# Patient Record
Sex: Female | Born: 1990 | Hispanic: No | Marital: Married | Smoking: Never smoker
Health system: Southern US, Community
[De-identification: ages and names within clinical notes are randomized; demographics above are authoritative.]

## PROBLEM LIST (undated history)

## (undated) DIAGNOSIS — Z789 Other specified health status: Secondary | ICD-10-CM

## (undated) HISTORY — PX: WISDOM TOOTH EXTRACTION: SHX21

---

## 2021-03-04 LAB — OB RESULTS CONSOLE RUBELLA ANTIBODY, IGM: Rubella: IMMUNE

## 2021-03-11 LAB — OB RESULTS CONSOLE HIV ANTIBODY (ROUTINE TESTING): HIV: NONREACTIVE

## 2021-03-11 LAB — OB RESULTS CONSOLE HEPATITIS B SURFACE ANTIGEN: Hepatitis B Surface Ag: NEGATIVE

## 2021-03-12 ENCOUNTER — Inpatient Hospital Stay (HOSPITAL_COMMUNITY)
Admission: AD | Admit: 2021-03-12 | Payer: No Typology Code available for payment source | Source: Home / Self Care | Admitting: Obstetrics and Gynecology

## 2021-03-12 LAB — OB RESULTS CONSOLE ABO/RH: RH Type: NEGATIVE

## 2021-03-20 LAB — OB RESULTS CONSOLE GC/CHLAMYDIA
Chlamydia: NEGATIVE
Neisseria Gonorrhea: NEGATIVE

## 2021-04-13 NOTE — L&D Delivery Note (Signed)
Delivery Note At 8:55 PM a viable female was delivered via Vaginal, Spontaneous OA Presentation  APGAR: 9, 10; weight  pending.   Placenta status: Spontaneous, Intact.  Cord: 3 vessels with the following complications: None.  Cord pH: not obtained  Anesthesia: Epidural Episiotomy: None Lacerations: 2nd degree Suture Repair: 3.0 chromic Est. Blood Loss (mL): 300  Mom to postpartum.  Baby to Couplet care / Skin to Skin.  Jeani Hawking 09/27/2021, 9:08 PM

## 2021-04-25 ENCOUNTER — Other Ambulatory Visit: Payer: Self-pay | Admitting: Obstetrics and Gynecology

## 2021-04-25 DIAGNOSIS — N6310 Unspecified lump in the right breast, unspecified quadrant: Secondary | ICD-10-CM

## 2021-05-15 ENCOUNTER — Ambulatory Visit
Admission: RE | Admit: 2021-05-15 | Discharge: 2021-05-15 | Disposition: A | Discharge status: Home / Self Care | Payer: No Typology Code available for payment source | Source: Ambulatory Visit | Attending: Obstetrics and Gynecology | Admitting: Obstetrics and Gynecology

## 2021-05-15 DIAGNOSIS — N6310 Unspecified lump in the right breast, unspecified quadrant: Secondary | ICD-10-CM

## 2021-09-21 LAB — OB RESULTS CONSOLE GBS: GBS: NEGATIVE

## 2021-09-26 ENCOUNTER — Inpatient Hospital Stay (HOSPITAL_COMMUNITY)
Admission: AD | Admit: 2021-09-26 | Discharge: 2021-09-29 | DRG: 807 | Disposition: A | Discharge status: Home / Self Care | Payer: No Typology Code available for payment source | Attending: Obstetrics and Gynecology | Admitting: Obstetrics and Gynecology

## 2021-09-26 ENCOUNTER — Encounter (HOSPITAL_COMMUNITY): Payer: Self-pay

## 2021-09-26 ENCOUNTER — Other Ambulatory Visit: Payer: Self-pay

## 2021-09-26 DIAGNOSIS — O26893 Other specified pregnancy related conditions, third trimester: Secondary | ICD-10-CM | POA: Diagnosis present

## 2021-09-26 DIAGNOSIS — O4292 Full-term premature rupture of membranes, unspecified as to length of time between rupture and onset of labor: Secondary | ICD-10-CM | POA: Diagnosis present

## 2021-09-26 DIAGNOSIS — Z3A37 37 weeks gestation of pregnancy: Secondary | ICD-10-CM | POA: Diagnosis not present

## 2021-09-26 DIAGNOSIS — Z3A38 38 weeks gestation of pregnancy: Secondary | ICD-10-CM | POA: Diagnosis not present

## 2021-09-26 DIAGNOSIS — O99214 Obesity complicating childbirth: Secondary | ICD-10-CM | POA: Diagnosis present

## 2021-09-26 HISTORY — DX: Other specified health status: Z78.9

## 2021-09-26 LAB — CBC
HCT: 34.7 % — ABNORMAL LOW (ref 36.0–46.0)
Hemoglobin: 12.3 g/dL (ref 12.0–15.0)
MCH: 32.4 pg (ref 26.0–34.0)
MCHC: 35.4 g/dL (ref 30.0–36.0)
MCV: 91.3 fL (ref 80.0–100.0)
Platelets: 201 10*3/uL (ref 150–400)
RBC: 3.8 MIL/uL — ABNORMAL LOW (ref 3.87–5.11)
RDW: 12.8 % (ref 11.5–15.5)
WBC: 12.7 10*3/uL — ABNORMAL HIGH (ref 4.0–10.5)
nRBC: 0 % (ref 0.0–0.2)

## 2021-09-26 MED ORDER — LACTATED RINGERS IV SOLN: INTRAVENOUS | Status: DC

## 2021-09-26 NOTE — MAU Provider Note (Signed)
S: Ms. [place] Meek is a 31 y.o. G1P0 at [redacted]w[redacted]d  who presents to MAU today complaining of leaking of fluid since 2100. She denies vaginal bleeding. She denies contractions. She reports normal fetal movement.    O: BP 123/82   Pulse (!) 112   Temp 98.7 F (37.1 C)   Resp 20   SpO2 98%  GENERAL: Well-developed, well-nourished female in no acute distress.  HEAD: Normocephalic, atraumatic.  CHEST: Normal effort of breathing, regular heart rate ABDOMEN: Soft, nontender, gravid PELVIC: Normal external female genitalia. Vagina is pink and rugated. Cervix with normal contour, no lesions. Normal discharge.  Positive pooling.   Cervical exam deferred  Pt informed that the ultrasound is considered a limited OB ultrasound and is not intended to be a complete ultrasound exam.  Patient also informed that the ultrasound is not being completed with the intent of assessing for fetal or placental anomalies or any pelvic abnormalities.  Explained that the purpose of today's ultrasound is to assess for  presentation.  Patient acknowledges the purpose of the exam and the limitations of the study.     Vertex presentation confirmed  Fetal Monitoring: Baseline: 125 Variability: moderate Accelerations: 15x15 Decelerations: none Contractions: occasional uc's  Fern-positive   A: SIUP at [redacted]w[redacted]d  SROM  P: Report given to RN to contact MD on call for further instructions  Rolm Bookbinder, CNM 09/26/2021 10:33 PM

## 2021-09-26 NOTE — MAU Note (Signed)
Deborah Roberson is a 31 y.o. at Unknown here in MAU reporting: a small gush of clear fluid one hour ago. Pt is unsure if her water broke or if she peed. Pt reports she had continued to leak. Pt denies ctx's.  Reports decreased fetal movement in the last hour since the gush of fluid.  Pt reports vaginal spotting after a cervical check on Wednesday.   Onset of complaint: today 1 hour ago  Pain score: denies  There were no vitals filed for this visit.    Lab orders placed from triage:  none

## 2021-09-27 ENCOUNTER — Inpatient Hospital Stay (HOSPITAL_COMMUNITY): Payer: No Typology Code available for payment source | Admitting: Anesthesiology

## 2021-09-27 ENCOUNTER — Encounter (HOSPITAL_COMMUNITY): Payer: Self-pay | Admitting: Obstetrics and Gynecology

## 2021-09-27 LAB — TYPE AND SCREEN
ABO/RH(D): O NEG
Antibody Screen: POSITIVE

## 2021-09-27 LAB — RPR: RPR Ser Ql: NONREACTIVE

## 2021-09-27 MED ORDER — OXYTOCIN-SODIUM CHLORIDE 30-0.9 UT/500ML-% IV SOLN: 1.0000 m[IU]/min | INTRAVENOUS | Status: DC

## 2021-09-27 MED ORDER — ONDANSETRON HCL 4 MG PO TABS: 4.0000 mg | ORAL_TABLET | ORAL | Status: DC | PRN

## 2021-09-27 MED ORDER — MEASLES, MUMPS & RUBELLA VAC IJ SOLR: 0.5000 mL | Freq: Once | INTRAMUSCULAR | Status: DC

## 2021-09-27 MED ORDER — OXYTOCIN-SODIUM CHLORIDE 30-0.9 UT/500ML-% IV SOLN
1.0000 m[IU]/min | INTRAVENOUS | Status: DC
  Administered 2021-09-27: 2 m[IU]/min via INTRAVENOUS
  Filled 2021-09-27: qty 500

## 2021-09-27 MED ORDER — SOD CITRATE-CITRIC ACID 500-334 MG/5ML PO SOLN: 30.0000 mL | ORAL | Status: DC | PRN

## 2021-09-27 MED ORDER — LACTATED RINGERS IV SOLN: 500.0000 mL | INTRAVENOUS | Status: DC | PRN

## 2021-09-27 MED ORDER — FENTANYL-BUPIVACAINE-NACL 0.5-0.125-0.9 MG/250ML-% EP SOLN
12.0000 mL/h | EPIDURAL | Status: DC | PRN
  Administered 2021-09-27: 12 mL/h via EPIDURAL
  Filled 2021-09-27: qty 250

## 2021-09-27 MED ORDER — ONDANSETRON HCL 4 MG/2ML IJ SOLN: 4.0000 mg | INTRAMUSCULAR | Status: DC | PRN

## 2021-09-27 MED ORDER — OXYCODONE-ACETAMINOPHEN 5-325 MG PO TABS: 1.0000 | ORAL_TABLET | ORAL | Status: DC | PRN

## 2021-09-27 MED ORDER — EPHEDRINE 5 MG/ML INJ
10.0000 mg | INTRAVENOUS | Status: DC | PRN
Start: 2021-09-27 — End: 2021-09-27

## 2021-09-27 MED ORDER — WITCH HAZEL-GLYCERIN EX PADS: 1.0000 | MEDICATED_PAD | CUTANEOUS | Status: DC | PRN

## 2021-09-27 MED ORDER — ONDANSETRON HCL 4 MG/2ML IJ SOLN: 4.0000 mg | Freq: Four times a day (QID) | INTRAMUSCULAR | Status: DC | PRN

## 2021-09-27 MED ORDER — EPHEDRINE 5 MG/ML INJ: 10.0000 mg | INTRAVENOUS | Status: DC | PRN

## 2021-09-27 MED ORDER — PHENYLEPHRINE 80 MCG/ML (10ML) SYRINGE FOR IV PUSH (FOR BLOOD PRESSURE SUPPORT): 80.0000 ug | PREFILLED_SYRINGE | INTRAVENOUS | Status: DC | PRN

## 2021-09-27 MED ORDER — SIMETHICONE 80 MG PO CHEW: 80.0000 mg | CHEWABLE_TABLET | ORAL | Status: DC | PRN

## 2021-09-27 MED ORDER — BENZOCAINE-MENTHOL 20-0.5 % EX AERO
1.0000 | INHALATION_SPRAY | CUTANEOUS | Status: DC | PRN
  Filled 2021-09-27: qty 56

## 2021-09-27 MED ORDER — ZOLPIDEM TARTRATE 5 MG PO TABS: 5.0000 mg | ORAL_TABLET | Freq: Every evening | ORAL | Status: DC | PRN

## 2021-09-27 MED ORDER — TETANUS-DIPHTH-ACELL PERTUSSIS 5-2.5-18.5 LF-MCG/0.5 IM SUSY: 0.5000 mL | PREFILLED_SYRINGE | Freq: Once | INTRAMUSCULAR | Status: DC

## 2021-09-27 MED ORDER — LACTATED RINGERS IV SOLN: 500.0000 mL | Freq: Once | INTRAVENOUS | Status: DC

## 2021-09-27 MED ORDER — OXYCODONE-ACETAMINOPHEN 5-325 MG PO TABS: 2.0000 | ORAL_TABLET | ORAL | Status: DC | PRN

## 2021-09-27 MED ORDER — OXYTOCIN-SODIUM CHLORIDE 30-0.9 UT/500ML-% IV SOLN
2.5000 [IU]/h | INTRAVENOUS | Status: DC
  Administered 2021-09-27: 2.5 [IU]/h via INTRAVENOUS

## 2021-09-27 MED ORDER — MEDROXYPROGESTERONE ACETATE 150 MG/ML IM SUSP: 150.0000 mg | INTRAMUSCULAR | Status: DC | PRN

## 2021-09-27 MED ORDER — IBUPROFEN 600 MG PO TABS
600.0000 mg | ORAL_TABLET | Freq: Four times a day (QID) | ORAL | Status: DC
  Administered 2021-09-28 – 2021-09-29 (×7): 600 mg via ORAL
  Filled 2021-09-27 (×7): qty 1

## 2021-09-27 MED ORDER — SENNOSIDES-DOCUSATE SODIUM 8.6-50 MG PO TABS
2.0000 | ORAL_TABLET | Freq: Every day | ORAL | Status: DC
  Administered 2021-09-28 – 2021-09-29 (×2): 2 via ORAL
  Filled 2021-09-27 (×2): qty 2

## 2021-09-27 MED ORDER — ACETAMINOPHEN 325 MG PO TABS
650.0000 mg | ORAL_TABLET | ORAL | Status: DC | PRN
  Administered 2021-09-28: 650 mg via ORAL
  Filled 2021-09-27: qty 2

## 2021-09-27 MED ORDER — DIPHENHYDRAMINE HCL 50 MG/ML IJ SOLN: 12.5000 mg | INTRAMUSCULAR | Status: DC | PRN

## 2021-09-27 MED ORDER — DIBUCAINE (PERIANAL) 1 % EX OINT: 1.0000 | TOPICAL_OINTMENT | CUTANEOUS | Status: DC | PRN

## 2021-09-27 MED ORDER — TERBUTALINE SULFATE 1 MG/ML IJ SOLN: 0.2500 mg | Freq: Once | INTRAMUSCULAR | Status: DC | PRN

## 2021-09-27 MED ORDER — ACETAMINOPHEN 325 MG PO TABS: 650.0000 mg | ORAL_TABLET | ORAL | Status: DC | PRN

## 2021-09-27 MED ORDER — LIDOCAINE HCL (PF) 1 % IJ SOLN
INTRAMUSCULAR | Status: DC | PRN
  Administered 2021-09-27 (×2): 5 mL via EPIDURAL

## 2021-09-27 MED ORDER — COCONUT OIL OIL: 1.0000 | TOPICAL_OIL | Status: DC | PRN

## 2021-09-27 MED ORDER — BISACODYL 10 MG RE SUPP: 10.0000 mg | Freq: Every day | RECTAL | Status: DC | PRN

## 2021-09-27 MED ORDER — LIDOCAINE HCL (PF) 1 % IJ SOLN: 30.0000 mL | INTRAMUSCULAR | Status: DC | PRN

## 2021-09-27 MED ORDER — FLEET ENEMA 7-19 GM/118ML RE ENEM: 1.0000 | ENEMA | RECTAL | Status: DC | PRN

## 2021-09-27 MED ORDER — FLEET ENEMA 7-19 GM/118ML RE ENEM: 1.0000 | ENEMA | Freq: Every day | RECTAL | Status: DC | PRN

## 2021-09-27 MED ORDER — DIPHENHYDRAMINE HCL 25 MG PO CAPS: 25.0000 mg | ORAL_CAPSULE | Freq: Four times a day (QID) | ORAL | Status: DC | PRN

## 2021-09-27 MED ORDER — OXYTOCIN BOLUS FROM INFUSION
333.0000 mL | Freq: Once | INTRAVENOUS | Status: AC
  Administered 2021-09-27: 333 mL via INTRAVENOUS

## 2021-09-27 MED ORDER — PRENATAL MULTIVITAMIN CH
1.0000 | ORAL_TABLET | Freq: Every day | ORAL | Status: DC
Start: 2021-09-28 — End: 2021-09-29
  Administered 2021-09-28 – 2021-09-29 (×2): 1 via ORAL
  Filled 2021-09-27 (×2): qty 1

## 2021-09-27 NOTE — H&P (Signed)
Deborah Roberson is a 31 y.o. G 1 P 0 at 38 weeks presents with SROM around 12 hours ago. She initially declined pitocin. Now on L and D she reports no contractions.  She has a doula who will be here later and she is unsure if she will want an epidural. OB History     Gravida  1   Para      Term      Preterm      AB      Living         SAB      IAB      Ectopic      Multiple      Live Births  0          Past Medical History:  Diagnosis Date   Medical history non-contributory    Past Surgical History:  Procedure Laterality Date   WISDOM TOOTH EXTRACTION Bilateral    Family History: family history is not on file. Social History:  reports that she has never smoked. She has never used smokeless tobacco. She reports that she does not drink alcohol and does not use drugs.     Maternal Diabetes: No Genetic Screening: Normal Maternal Ultrasounds/Referrals: Normal Fetal Ultrasounds or other Referrals:  None Maternal Substance Abuse:  No Significant Maternal Medications:  None Significant Maternal Lab Results:  Group B Strep negative Other Comments:  None  Review of Systems  All other systems reviewed and are negative.  Maternal Medical History:  Reason for admission: Rupture of membranes.     Dilation: 2.5 Effacement (%): 50 Station: -2 Exam by:: Xitlaly Ault Blood pressure 120/63, pulse 86, temperature 97.9 F (36.6 C), temperature source Oral, resp. rate 20, SpO2 98 %. Maternal Exam:  Abdomen: Fetal presentation: vertex   Fetal Exam Fetal State Assessment: Category I - tracings are normal.   Physical Exam Vitals and nursing note reviewed. Exam conducted with a chaperone present.  Constitutional:      Appearance: Normal appearance.  HENT:     Head: Normocephalic.     Mouth/Throat:     Mouth: Mucous membranes are moist.  Eyes:     Pupils: Pupils are equal, round, and reactive to light.  Cardiovascular:     Rate and Rhythm: Normal rate and regular  rhythm.  Pulmonary:     Effort: Pulmonary effort is normal.  Neurological:     Mental Status: She is alert.     Prenatal labs: ABO, Rh: --/--/O NEG (06/16 2301) Antibody: POS (06/16 2301) Rubella: Immune (11/22 0000) RPR: NON REACTIVE (06/16 2301)  HBsAg: Negative (11/29 0000)  HIV: Non-reactive (11/29 0000)  GBS: Negative/-- (06/11 0000)   Assessment/Plan: IUP at term SROM Discussed with patient and her spouse given SROM already 12 hours I recommend Pitocin for labor augmentation They agree  Questions answered at the bedside   Jeani Hawking 09/27/2021, 8:42 AM

## 2021-09-27 NOTE — Anesthesia Preprocedure Evaluation (Signed)
Anesthesia Evaluation  Patient identified by MRN, date of birth, ID band Patient awake    Reviewed: Allergy & Precautions, Patient's Chart, lab work & pertinent test results  Airway Mallampati: II  TM Distance: >3 FB Neck ROM: Full    Dental no notable dental hx.    Pulmonary neg pulmonary ROS,    Pulmonary exam normal        Cardiovascular negative cardio ROS Normal cardiovascular exam     Neuro/Psych negative neurological ROS  negative psych ROS   GI/Hepatic negative GI ROS, Neg liver ROS,   Endo/Other  Obesity  Renal/GU negative Renal ROS  negative genitourinary   Musculoskeletal negative musculoskeletal ROS (+)   Abdominal (+) + obese,   Peds  Hematology negative hematology ROS (+)   Anesthesia Other Findings   Reproductive/Obstetrics (+) Pregnancy                             Anesthesia Physical Anesthesia Plan  ASA: 2  Anesthesia Plan: Epidural   Post-op Pain Management:    Induction:   PONV Risk Score and Plan:   Airway Management Planned: Natural Airway  Additional Equipment:   Intra-op Plan:   Post-operative Plan:   Informed Consent: I have reviewed the patients History and Physical, chart, labs and discussed the procedure including the risks, benefits and alternatives for the proposed anesthesia with the patient or authorized representative who has indicated his/her understanding and acceptance.       Plan Discussed with: Anesthesiologist  Anesthesia Plan Comments:         Anesthesia Quick Evaluation

## 2021-09-27 NOTE — Lactation Note (Signed)
This note was copied from a baby's chart. Lactation Consultation Note Assisted in latching mom. Needed to do football hold. Baby constantly off and on. Unable to maintain latch. Will latch and suckle a couple of times when stops suckling looses latch. T-cup hold needed to hold breast tissue in baby's mouth. Breast tissue is very compressible. Mom will need shells and DEBP when she gets to [facility]. LC will f/u w/mom on MBU.  Patient Name: Deborah Roberson Today's Date: 09/27/2021 Reason for consult: L&D Initial assessment;Primapara;Early term 37-38.6wks Age:76 hours  Maternal Data Does the patient have breastfeeding experience prior to this delivery?: No  Feeding    LATCH Score Latch: Repeated attempts needed to sustain latch, nipple held in mouth throughout feeding, stimulation needed to elicit sucking reflex.  Audible Swallowing: None  Type of Nipple: Flat  Comfort (Breast/Nipple): Soft / non-tender  Hold (Positioning): Full assist, staff holds infant at breast  LATCH Score: 4   Lactation Tools Discussed/Used    Interventions Interventions: Adjust position;Assisted with latch;Support pillows;Skin to skin;Position options;Breast massage;Breast compression  Discharge    Consult Status Consult Status: Follow-up from L&D Date: 09/28/21 Follow-up type: In-patient    Charyl Dancer 09/27/2021, 10:12 PM

## 2021-09-27 NOTE — Anesthesia Procedure Notes (Signed)
Epidural Patient location during procedure: OB Start time: 09/27/2021 2:08 PM End time: 09/27/2021 2:17 PM  Staffing Anesthesiologist: Mal Amabile, MD Performed: anesthesiologist   Preanesthetic Checklist Completed: patient identified, IV checked, site marked, risks and benefits discussed, surgical consent, monitors and equipment checked, pre-op evaluation and timeout performed  Epidural Patient position: sitting Prep: DuraPrep and site prepped and draped Patient monitoring: continuous pulse ox and blood pressure Approach: midline Location: L3-L4 Injection technique: LOR air  Needle:  Needle type: Tuohy  Needle gauge: 17 G Needle length: 9 cm and 9 Needle insertion depth: 6 cm Catheter type: closed end flexible Catheter size: 19 Gauge Catheter at skin depth: 11 cm Test dose: negative and Other  Assessment Events: blood not aspirated, injection not painful, no injection resistance, no paresthesia and negative IV test  Additional Notes Patient identified. Risks and benefits discussed including failed block, incomplete  Pain control, post dural puncture headache, nerve damage, paralysis, blood pressure Changes, nausea, vomiting, reactions to medications-both toxic and allergic and post Partum back pain. All questions were answered. Patient expressed understanding and wished to proceed. Sterile technique was used throughout procedure. Epidural site was Dressed with sterile barrier dressing. No paresthesias, signs of intravascular injection Or signs of intrathecal spread were encountered.  Patient was more comfortable after the epidural was dosed. Please see RN's note for documentation of vital signs and FHR which are stable. Reason for block:procedure for pain

## 2021-09-28 LAB — CBC
HCT: 32.6 % — ABNORMAL LOW (ref 36.0–46.0)
Hemoglobin: 11.6 g/dL — ABNORMAL LOW (ref 12.0–15.0)
MCH: 32 pg (ref 26.0–34.0)
MCHC: 35.6 g/dL (ref 30.0–36.0)
MCV: 89.8 fL (ref 80.0–100.0)
Platelets: 189 10*3/uL (ref 150–400)
RBC: 3.63 MIL/uL — ABNORMAL LOW (ref 3.87–5.11)
RDW: 12.7 % (ref 11.5–15.5)
WBC: 18.8 10*3/uL — ABNORMAL HIGH (ref 4.0–10.5)
nRBC: 0 % (ref 0.0–0.2)

## 2021-09-28 MED ORDER — RHO D IMMUNE GLOBULIN 1500 UNIT/2ML IJ SOSY
300.0000 ug | PREFILLED_SYRINGE | Freq: Once | INTRAMUSCULAR | Status: AC
Start: 2021-09-28 — End: 2021-09-28
  Administered 2021-09-28: 300 ug via INTRAVENOUS
  Filled 2021-09-28: qty 2

## 2021-09-28 NOTE — Lactation Note (Addendum)
This note was copied from a baby's chart. Lactation Consultation Note  Patient Name: Deborah Roberson Today's Date: 09/28/2021 Reason for consult: Initial assessment;Primapara;1st time breastfeeding;Early term 37-38.6wks;Breastfeeding assistance;Other (Comment);Infant weight loss (2 % weight loss) Age:32 hours/ P1  As LC entered the room mom starting to BF with dads assistance. Dad mentioned he had changed a smear diaper.  LC offered to assist  to latch, mom receptive.  LC reviewed breast feeding goals for 24 hours , and steps for latching.  LC noted areola edema - and recommended prior to latching on the 1st breast  Breast massage, hand express, and reverse pressure ( areolas soften well ) ,  Baby made a good effort to latch for 5-6 strong sucks and released.  After latch attempt, hand expressed tiny drops. Latch score 6  Dr. Earlene Plater in to exam baby, and baby settled in the crib.  Mom and dad aware LC will F/U for latch assessment  Maternal Data Has patient been taught Hand Expression?: Yes Per mom + breast changes with pregnancy  Feeding Mother's Current Feeding Choice: Breast Milk  LATCH Score Latch: Repeated attempts needed to sustain latch, nipple held in mouth throughout feeding, stimulation needed to elicit sucking reflex.  Audible Swallowing: None  Type of Nipple: Everted at rest and after stimulation (areola edema)  Comfort (Breast/Nipple): Soft / non-tender  Hold (Positioning): Assistance needed to correctly position infant at breast and maintain latch.  LATCH Score: 6   Lactation Tools Discussed/Used    Interventions Interventions: Breast feeding basics reviewed;Assisted with latch;Skin to skin;Breast massage;Hand express;Reverse pressure;Breast compression;Adjust position;Support pillows;Position options;Education;LC Services brochure  Discharge Pump: DEBP;Personal WIC Program: No  Consult Status Consult Status: Follow-up Date: 09/28/21 Follow-up type:  In-patient    Deborah Roberson 09/28/2021, 8:47 AM

## 2021-09-28 NOTE — Progress Notes (Signed)
CSW received consult for hx of Anxiety.  CSW met with MOB to offer support and complete assessment.    CSW met with MOB at bedside to complete assessment. When CSW entered, MOB was observed sitting in hospital bed, FOB was present sitting on couch, and infant was sleeping in basinet. MOB granted verbal permission to speak in front of FOB about anything. MOB presented as calm and relaxed and was observed smiling. MOB and FOB were polite and engaged towards CSW.  CSW inquired how MOB has been feeling emotionally since giving birth, MOB reported she has been feeling really good and joyful. MOB reported a history of anxiety and "OCD tendencies" throughout her life. MOB denies a history of depression. MOB reports she was previously prescribed lexapro through her PCP 2019-2021 and discontinued medication per PCP's approval due to wanting to start a family and having sufficient coping skills. MOB reports she was also in therapy in the past (2019-2020) but is not current with a therapist. CSW provided a list of therapy resources to MOB in the case that MOB feels she needs additional support in the future.   MOB reports some prenatal anxiety during her first trimester stating her anxiety was triggered by health concerns about the health of her and the baby, marked by "overthinking". MOB reports anxiety gradually improved after the first trimester. MOB reports she contacted therapists during her first trimester but was unable to secure an appointment and did not follow up after her anxiety symptoms decreased into her second trimester. MOB reports she is open to psychotropic medication management and/or therapy if she experiences increased anxiety in the future but declined the need for mental health services at this time. MOB denied current SI/HI/DV.   MOB reports a strong support network, marked by FOB, MOB's parents, and friends. MOB identified coping skills including "talking it out", having her support network  tell her when she is having irrational thoughts, and using words of affirmation.   MOB reports she has all necessary baby items including a car seat, basinet, and crib. MOB declined additional parenting resources at this time.   CSW provided education regarding the baby blues period vs. perinatal mood disorders, discussed treatment and gave resources for mental health follow up if concerns arise.  CSW recommends self-evaluation during the postpartum time period using the New Mom Checklist from Postpartum Progress and encouraged MOB to contact a medical professional if symptoms are noted at any time.   CSW provided review of Sudden Infant Death Syndrome (SIDS) precautions.   CSW identifies no further need for intervention and no barriers to discharge at this time. 

## 2021-09-28 NOTE — Progress Notes (Signed)
PPD # 1  Doing well. Notices some soreness. BP 122/75 (BP Location: Right Arm)   Pulse 96   Temp 98.2 F (36.8 C) (Oral)   Resp 16   Ht 5\' 4"  (1.626 m)   Wt 97.5 kg   SpO2 98% Comment: Room Air  Breastfeeding Unknown   BMI 36.90 kg/m  Results for orders placed or performed during the hospital encounter of 09/26/21 (from the past 24 hour(s))  CBC     Status: Abnormal   Collection Time: 09/28/21  5:10 AM  Result Value Ref Range   WBC 18.8 (H) 4.0 - 10.5 K/uL   RBC 3.63 (L) 3.87 - 5.11 MIL/uL   Hemoglobin 11.6 (L) 12.0 - 15.0 g/dL   HCT 09/30/21 (L) 96.0 - 45.4 %   MCV 89.8 80.0 - 100.0 fL   MCH 32.0 26.0 - 34.0 pg   MCHC 35.6 30.0 - 36.0 g/dL   RDW 09.8 11.9 - 14.7 %   Platelets 189 150 - 400 K/uL   nRBC 0.0 0.0 - 0.2 %  Rh IG workup (includes ABO/Rh)     Status: None (Preliminary result)   Collection Time: 09/28/21  5:19 AM  Result Value Ref Range   Gestational Age(Wks) 38    Fetal Screen NEG    Unit Number 39    Blood Component Type RHIG    Unit division 00    Status of Unit ALLOCATED    Transfusion Status      OK TO TRANSFUSE Performed at [facility], [address], [place], [place]    Lochia WNL Uterus is WNL  PPD # 1  Doing well Circ tomorrow - baby has not had a good feeding yet Discharge home tomorrow

## 2021-09-28 NOTE — Anesthesia Postprocedure Evaluation (Signed)
Anesthesia Post Note  Patient: Deborah Roberson  Procedure(s) Performed: AN AD HOC LABOR EPIDURAL     Patient location during evaluation: Mother Baby Anesthesia Type: Epidural Level of consciousness: awake and alert Pain management: pain level controlled Vital Signs Assessment: post-procedure vital signs reviewed and stable Respiratory status: spontaneous breathing, nonlabored ventilation and respiratory function stable Cardiovascular status: stable Postop Assessment: no headache, no backache, epidural receding, no apparent nausea or vomiting, adequate PO intake and able to ambulate Anesthetic complications: no   No notable events documented.  Last Vitals:  Vitals:   09/28/21 0430 09/28/21 0910  BP: 122/75 128/75  Pulse: 96 100  Resp: 16 18  Temp: 36.8 C 36.9 C  SpO2: 98% 98%    Last Pain:  Vitals:   09/28/21 0910  TempSrc: Oral  PainSc: 2    Pain Goal: Patients Stated Pain Goal: 2 (09/28/21 0910)                 Laban Emperor

## 2021-09-29 LAB — RH IG WORKUP (INCLUDES ABO/RH)
Fetal Screen: NEGATIVE
Gestational Age(Wks): 38
Unit division: 0

## 2021-09-29 NOTE — Discharge Summary (Signed)
Postpartum Discharge Summary  Date of Service updated 09/29/21     Patient Name: Deborah Roberson DOB: [birth year] MRN: [id number]  Date of admission: 09/26/2021 Delivery date:09/27/2021  Delivering provider: Dian Queen  Date of discharge: 09/29/2021  Admitting diagnosis: Normal labor and delivery [O80] NSVD (normal spontaneous vaginal delivery) [O80] Intrauterine pregnancy: [redacted]w[redacted]d    Secondary diagnosis:  Principal Problem:   Normal labor and delivery Active Problems:   NSVD (normal spontaneous vaginal delivery)  Additional problems: None    Discharge diagnosis: Term Pregnancy Delivered                                              Augmentation: Pitocin Complications: None  Hospital course: Induction of Labor With Vaginal Delivery   31y.o. yo G1P1001 at 33w0das admitted to the hospital 09/26/2021 for induction of labor.  Indication for induction: PROM.  Patient had an uncomplicated labor course as follows: Membrane Rupture Time/Date: 9:00 PM ,09/26/2021   Delivery Method:Vaginal, Spontaneous  Episiotomy: None  Lacerations:  2nd degree;Perineal  Details of delivery can be found in separate delivery note.  Patient had a routine postpartum course. Patient is discharged home 09/29/21.  Newborn Data: Birth date:09/27/2021  Birth time:8:55 PM  Gender:Female  Living status:Living  Apgars:9 ,10  Weight:3350 g   Magnesium Sulfate received: No BMZ received: No MMR:N/A T-DaP:Given prenatally Flu: N/A Transfusion:No  Physical exam  Vitals:   09/28/21 0910 09/28/21 1220 09/28/21 2116 09/29/21 0622  BP: 128/75 127/80 128/70 121/73  Pulse: 100 (!) 105 91 84  Resp: _0 Temp: 98.4 F (36.9 C) 98.4 F (36.9 C) 98.2 F (36.8 C) 97.7 F (36.5 C)  TempSrc: Oral Oral Oral Oral  SpO2: 98% 98% 97% 98%  Weight:      Height:       General: alert Lochia: appropriate Uterine Fundus: firm Incision: N/A DVT Evaluation: No evidence of DVT seen on physical  exam. Labs: Lab Results  Component Value Date   WBC 18.8 (H) 09/28/2021   HGB 11.6 (L) 09/28/2021   HCT 32.6 (L) 09/28/2021   MCV 89.8 09/28/2021   PLT 189 09/28/2021       No data to display         Edinburgh Score:    09/27/2021   11:30 PM  Edinburgh Postnatal Depression Scale Screening Tool  I have been able to laugh and see the funny side of things. 0  I have looked forward with enjoyment to things. 0  I have blamed myself unnecessarily when things went wrong. 0  I have been anxious or worried for no good reason. 1  I have felt scared or panicky for no good reason. 1  Things have been getting on top of me. 0  I have been so unhappy that I have had difficulty sleeping. 0  I have felt sad or miserable. 0  I have been so unhappy that I have been crying. 1  The thought of harming myself has occurred to me. 0  Edinburgh Postnatal Depression Scale Total 3      After visit meds:  Allergies as of 09/29/2021   No Known Allergies      Medication List     TAKE these medications    prenatal multivitamin Tabs tablet Take 1 tablet by mouth daily at 12 noon.  Discharge home in stable condition Infant Feeding: Bottle and Breast Infant Disposition:home with mother Discharge instruction: per After Visit Summary and Postpartum booklet. Activity: Advance as tolerated. Pelvic rest for 6 weeks.  Diet: routine diet Anticipated Birth Control: Unsure Postpartum Appointment:6 weeks Additional Postpartum F/U:  None Future Appointments:No future appointments. Follow up Visit:      09/29/2021 Tyson Dense, MD

## 2021-09-29 NOTE — Lactation Note (Signed)
This note was copied from a baby's chart. Lactation Consultation Note  Patient Name: Deborah Roberson Today's Date: 09/29/2021 Reason for consult: Follow-up assessment Age:32 hours  P1, Baby 35 hours old and request to assist with latching. Reviewed hand expression with good flow. Assisted with helping latch deeper on breast. Noted a few short sucking bursts.  Latch depth improved with teacup hold.  Baby had short feeding and fell asleep. Suggest calling when baby wakes to assist with another feeding. Both parents very attentive and wanting to learn.  Encouraged OP appt.   Reviewed engorgement care and monitoring voids/stools.   Maternal Data Has patient been taught Hand Expression?: Yes Does the patient have breastfeeding experience prior to this delivery?: No  Feeding Mother's Current Feeding Choice: Breast Milk  LATCH Score Latch: Repeated attempts needed to sustain latch, nipple held in mouth throughout feeding, stimulation needed to elicit sucking reflex.  Audible Swallowing: A few with stimulation  Type of Nipple: Everted at rest and after stimulation  Comfort (Breast/Nipple): Soft / non-tender  Hold (Positioning): Assistance needed to correctly position infant at breast and maintain latch.  LATCH Score: 7   Lactation Tools Discussed/Used    Interventions Interventions: Breast feeding basics reviewed;Assisted with latch;Skin to skin;Hand express;Education  Discharge Discharge Education: Engorgement and breast care;Outpatient recommendation;Warning signs for feeding baby Pump: Personal;DEBP (Spectra)  Consult Status Consult Status: Follow-up Date: 09/29/21 Follow-up type: In-patient    Dahlia Byes [facility] 09/29/2021, 8:17 AM

## 2021-10-07 ENCOUNTER — Telehealth (HOSPITAL_COMMUNITY): Payer: Self-pay | Admitting: *Deleted

## 2023-01-02 IMAGING — US US BREAST*R* LIMITED INC AXILLA
1 series · 2 of 2 positions shown · non-contrast
Comparison: None.

CLINICAL DATA: Patient presents with a contour change, an
indentation, along the inner aspect of the right breast. Patient is
16 weeks pregnant.

EXAM:
ULTRASOUND OF THE RIGHT BREAST

[Series 1: us breast*right* limited inc axilla · 0.09mm/px · 2 of 2 slices shown]
[im 1/2]
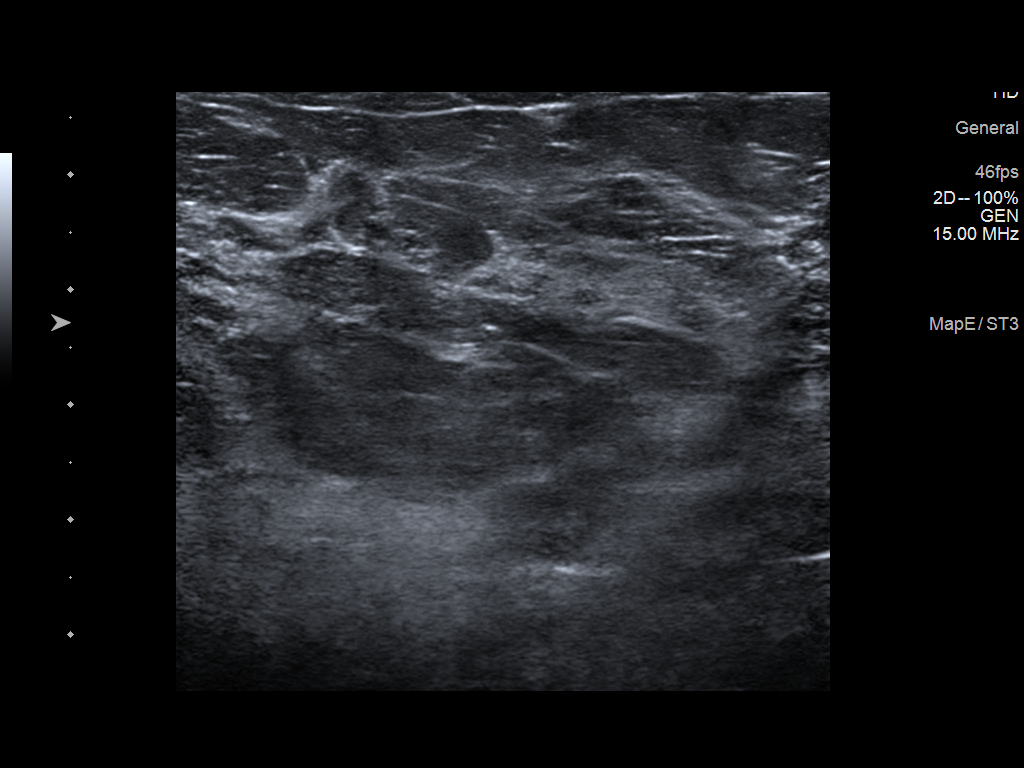
[im 2/2]
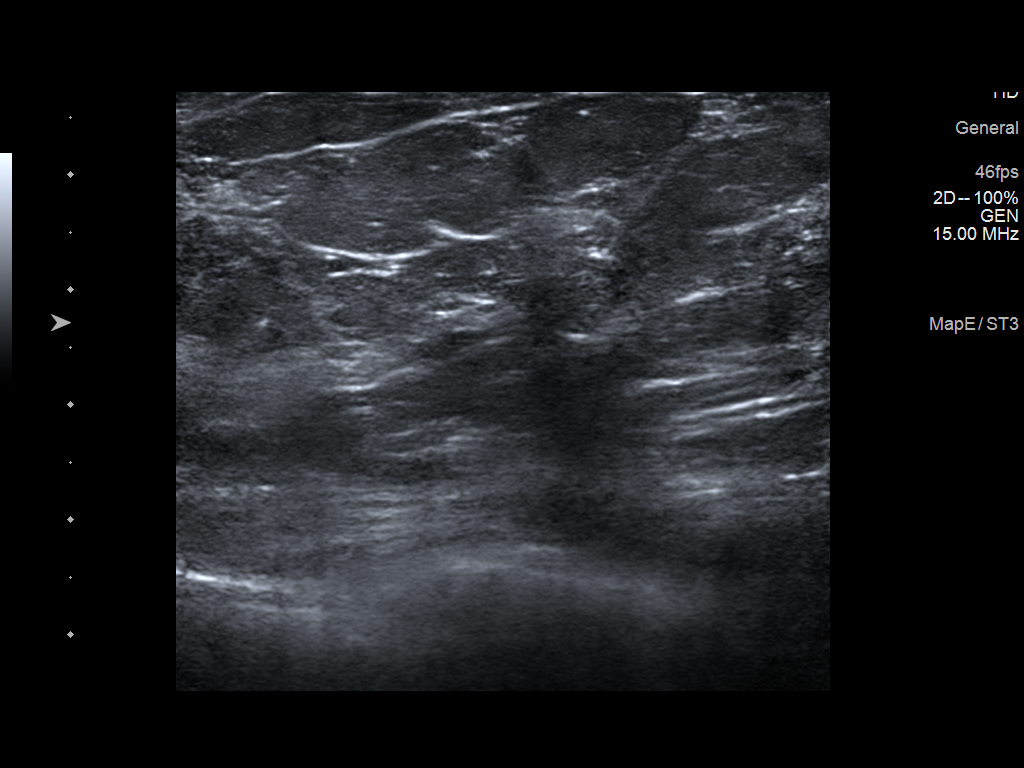

[2 of 2 positions shown; findings below may reference images not displayed]

FINDINGS: On physical exam, there is no palpable lump and no definite skin
indentation along the lower slightly outer aspect of the right
breast.

Targeted ultrasound is performed, showing normal tissue right breast
7 o'clock, 3 cm the nipple, which corresponds to the area reported
abnormality. No mass or suspicious lesion.
IMPRESSION: No evidence of breast malignancy.

RECOMMENDATION:
1. Screening mammogram at age 40 unless there are persistent or
intervening clinical concerns. (Code:73-G-EE3)

I have discussed the findings and recommendations with the patient.
If applicable, a reminder letter will be sent to the patient
regarding the next appointment.

BI-RADS CATEGORY  1: Negative.

## 2023-02-09 LAB — OB RESULTS CONSOLE HEPATITIS B SURFACE ANTIGEN: Hepatitis B Surface Ag: NEGATIVE

## 2023-02-09 LAB — OB RESULTS CONSOLE RUBELLA ANTIBODY, IGM: Rubella: IMMUNE

## 2023-02-16 LAB — OB RESULTS CONSOLE GC/CHLAMYDIA
Chlamydia: NEGATIVE
Neisseria Gonorrhea: NEGATIVE

## 2023-04-14 NOTE — L&D Delivery Note (Signed)
 Delivery Note At 10:43 AM a viable female was delivered via Vaginal, Spontaneous (Presentation: Right Occiput Anterior).  APGAR: 9, 9; weight  pending.   Placenta status: Spontaneous, Intact.  Cord: 3 vessels with the following complications: None.  Cord pH: n/a  Anesthesia: Epidural Episiotomy: None Lacerations: 2nd degree Suture Repair: 3.0 vicryl rapide Est. Blood Loss (mL):  357  Mom to postpartum.  Baby to Couplet care / Skin to Skin.  [name] 08/11/2023, 11:19 AM

## 2023-06-10 LAB — OB RESULTS CONSOLE RPR: RPR: NONREACTIVE

## 2023-06-10 LAB — OB RESULTS CONSOLE HIV ANTIBODY (ROUTINE TESTING): HIV: NONREACTIVE

## 2023-08-08 ENCOUNTER — Inpatient Hospital Stay (HOSPITAL_COMMUNITY)
Admission: AD | Admit: 2023-08-08 | Discharge: 2023-08-09 | Disposition: A | Discharge status: Home / Self Care | Source: Ambulatory Visit | Attending: Obstetrics and Gynecology | Admitting: Obstetrics and Gynecology

## 2023-08-08 ENCOUNTER — Other Ambulatory Visit: Payer: Self-pay

## 2023-08-08 DIAGNOSIS — O99343 Other mental disorders complicating pregnancy, third trimester: Secondary | ICD-10-CM | POA: Insufficient documentation

## 2023-08-08 DIAGNOSIS — F419 Anxiety disorder, unspecified: Secondary | ICD-10-CM | POA: Insufficient documentation

## 2023-08-08 DIAGNOSIS — Z3A36 36 weeks gestation of pregnancy: Secondary | ICD-10-CM | POA: Insufficient documentation

## 2023-08-08 NOTE — MAU Note (Addendum)
 Patient presents to MAU triage with complaints of tachycardia. Patient states she had a run of tachycardia along with palpitations around 2030 this evening. HR ran between 140-150. Patient states that the tachycardia lasted about 30 mins.Patient has a history of anxiety and takes zoloft.

## 2023-08-09 ENCOUNTER — Encounter (HOSPITAL_COMMUNITY): Payer: Self-pay | Admitting: Obstetrics and Gynecology

## 2023-08-09 ENCOUNTER — Other Ambulatory Visit: Payer: Self-pay

## 2023-08-09 DIAGNOSIS — O26893 Other specified pregnancy related conditions, third trimester: Secondary | ICD-10-CM | POA: Diagnosis not present

## 2023-08-09 DIAGNOSIS — F419 Anxiety disorder, unspecified: Secondary | ICD-10-CM | POA: Diagnosis not present

## 2023-08-09 DIAGNOSIS — Z3A36 36 weeks gestation of pregnancy: Secondary | ICD-10-CM

## 2023-08-09 DIAGNOSIS — O99343 Other mental disorders complicating pregnancy, third trimester: Secondary | ICD-10-CM | POA: Diagnosis not present

## 2023-08-09 LAB — POCT FERN TEST: POCT Fern Test: NEGATIVE

## 2023-08-09 LAB — GROUP B STREP BY PCR: Group B strep by PCR: NEGATIVE

## 2023-08-09 MED ORDER — LORAZEPAM 2 MG/ML IJ SOLN
1.0000 mg | Freq: Once | INTRAMUSCULAR | Status: AC
  Administered 2023-08-09: 1 mg via INTRAVENOUS
  Filled 2023-08-09: qty 1

## 2023-08-09 MED ORDER — LACTATED RINGERS IV BOLUS
1000.0000 mL | Freq: Once | INTRAVENOUS | Status: AC
  Administered 2023-08-09: 1000 mL via INTRAVENOUS

## 2023-08-09 MED ORDER — HYDROXYZINE HCL 50 MG/ML IM SOLN
25.0000 mg | Freq: Four times a day (QID) | INTRAMUSCULAR | Status: DC | PRN
Start: 2023-08-09 — End: 2023-08-09
  Filled 2023-08-09: qty 0.5

## 2023-08-09 NOTE — MAU Provider Note (Signed)
 History     CSN: [id number]  Arrival date and time: 08/08/23 2225   Event Date/Time   First Provider Initiated Contact with Patient 08/09/23 0047      Chief Complaint  Patient presents with   Tachycardia   HPI Deborah Roberson is a 33 y.o. year old G79P1001 female at [redacted]w[redacted]d weeks gestation who presents to MAU reporting fast heart rate and palpitations @ 2030 this evening. She reports her heart rate was between 140s and 150s x 30 mins. She takes Zoloft at bedtime for anxiety, but has not had it yet tonight. Her last dose was Saturday night. She denies H/A, blurry vision, UCs, abnormal vaginal d/c, or LOF. She receives [facility] with [facility]; next appt is 08/10/2023. Her spouse is present and contributing to the history taking.    OB History     Gravida  2   Para  1   Term  1   Preterm      AB      Living  1      SAB      IAB      Ectopic      Multiple  0   Live Births  1           Past Medical History:  Diagnosis Date   Medical history non-contributory     Past Surgical History:  Procedure Laterality Date   WISDOM TOOTH EXTRACTION Bilateral     History reviewed. No pertinent family history.  Social History   Tobacco Use   Smoking status: Never   Smokeless tobacco: Never  Vaping Use   Vaping status: Never Used  Substance Use Topics   Alcohol use: Never   Drug use: Never    Allergies: No Known Allergies  Medications Prior to Admission  Medication Sig Dispense Refill Last Dose/Taking   Prenatal Vit-Fe Fumarate-FA (PRENATAL MULTIVITAMIN) TABS tablet Take 1 tablet by mouth daily at 12 noon.   08/08/2023   sertraline (ZOLOFT) 50 MG tablet Take 50 mg by mouth at bedtime.   08/08/2023    Review of Systems  Constitutional: Negative.   HENT: Negative.    Eyes: Negative.   Respiratory: Negative.    Cardiovascular: Negative.   Gastrointestinal: Negative.   Endocrine: Negative.   Genitourinary:  Negative for pelvic pain.  Musculoskeletal:  Negative.   Skin: Negative.   Allergic/Immunologic: Negative.   Neurological: Negative.   Hematological: Negative.   Psychiatric/Behavioral:  The patient is nervous/anxious.    Physical Exam   Blood pressure 129/85, pulse (!) 126, temperature 97.8 F (36.6 C), temperature source Oral, resp. rate 20, SpO2 98%, unknown if currently breastfeeding.  Physical Exam Vitals and nursing note reviewed. Exam conducted with a chaperone present.  Constitutional:      Appearance: Normal appearance.  Cardiovascular:     Rate and Rhythm: Tachycardia present.     Pulses: Normal pulses.  Pulmonary:     Effort: Pulmonary effort is normal.  Abdominal:     Palpations: Abdomen is soft.  Neurological:     Mental Status: She is alert.  Psychiatric:        Attention and Perception: Attention and perception normal.        Mood and Affect: Mood is anxious.        Speech: Speech normal.        Behavior: Behavior normal. Behavior is cooperative.        Thought Content: Thought content normal.  Cognition and Memory: Cognition and memory normal.        Judgment: Judgment normal.   Cervical Dilation #1: 3/70/Ballotable by Tyna Gallery, RN  Cervical Dilation #2: Dilation: 4 Effacement (%): 80 Station: Ballotable Presentation: Vertex Exam by:: Bria Torrence   Cervical Dilation #3: unchanged @ 0345    MAU Course  Procedures **IV started by this CNM: 20 G IV in RT hand - labs drawn - LR bolus infusing without difficulty**   MDM Start IV LR 1 liter bolus @ 999 ml/hr Ativan 1 mg IVP -- patient more relaxed, but tachycardia remains GBS by PCR Fern Slide -- Negative per Hulda Mage, RN Results for orders placed or performed during the hospital encounter of 08/08/23 (from the past 24 hours)  Group B strep by PCR     Status: None   Collection Time: 08/09/23  2:07 AM   Specimen: Vaginal/Rectal; Genital  Result Value Ref Range   Group B strep by PCR PRESUMPTIVE NEGATIVE PRESUMPTIVE NEGATIVE  Fern  Test     Status: Normal   Collection Time: 08/09/23  3:58 AM  Result Value Ref Range   POCT Fern Test Negative = intact amniotic membranes     Assessment and Plan  1. Anxiety during pregnancy in third trimester, antepartum (Primary) - Information provided on Managing Anxiety, Mindfulness-Based Stress Reduction and Perinatal Anxiety Disorder - Advised to not miss doses of Zoloft  2. [redacted] weeks gestation of pregnancy - Information provided on Signs and Symptoms of Labor    - Discharge home - Keep scheduled appt with [facility] on 08/10/2023 - Patient verbalized an understanding of the plan of care and agrees.   Almond Army, CNM 08/09/2023, 1:41 AM

## 2023-08-11 ENCOUNTER — Other Ambulatory Visit: Payer: Self-pay

## 2023-08-11 ENCOUNTER — Inpatient Hospital Stay (HOSPITAL_COMMUNITY): Admitting: Anesthesiology

## 2023-08-11 ENCOUNTER — Encounter (HOSPITAL_COMMUNITY): Payer: Self-pay | Admitting: Obstetrics and Gynecology

## 2023-08-11 ENCOUNTER — Inpatient Hospital Stay (HOSPITAL_COMMUNITY)
Admission: AD | Admit: 2023-08-11 | Discharge: 2023-08-12 | DRG: 807 | Disposition: A | Discharge status: Home / Self Care | Attending: Obstetrics & Gynecology | Admitting: Obstetrics & Gynecology

## 2023-08-11 DIAGNOSIS — O26893 Other specified pregnancy related conditions, third trimester: Secondary | ICD-10-CM | POA: Diagnosis present

## 2023-08-11 DIAGNOSIS — Z3A36 36 weeks gestation of pregnancy: Secondary | ICD-10-CM

## 2023-08-11 DIAGNOSIS — O3663X0 Maternal care for excessive fetal growth, third trimester, not applicable or unspecified: Secondary | ICD-10-CM | POA: Diagnosis present

## 2023-08-11 DIAGNOSIS — O99344 Other mental disorders complicating childbirth: Secondary | ICD-10-CM | POA: Diagnosis present

## 2023-08-11 DIAGNOSIS — F419 Anxiety disorder, unspecified: Secondary | ICD-10-CM | POA: Diagnosis present

## 2023-08-11 LAB — CBC
HCT: 34.9 % — ABNORMAL LOW (ref 36.0–46.0)
Hemoglobin: 11.8 g/dL — ABNORMAL LOW (ref 12.0–15.0)
MCH: 30.8 pg (ref 26.0–34.0)
MCHC: 33.8 g/dL (ref 30.0–36.0)
MCV: 91.1 fL (ref 80.0–100.0)
Platelets: 177 10*3/uL (ref 150–400)
RBC: 3.83 MIL/uL — ABNORMAL LOW (ref 3.87–5.11)
RDW: 13.2 % (ref 11.5–15.5)
WBC: 13.1 10*3/uL — ABNORMAL HIGH (ref 4.0–10.5)
nRBC: 0 % (ref 0.0–0.2)

## 2023-08-11 LAB — RPR: RPR Ser Ql: NONREACTIVE

## 2023-08-11 LAB — TYPE AND SCREEN
ABO/RH(D): O NEG
Antibody Screen: NEGATIVE

## 2023-08-11 LAB — HIV ANTIBODY (ROUTINE TESTING W REFLEX): HIV Screen 4th Generation wRfx: NONREACTIVE

## 2023-08-11 MED ORDER — OXYTOCIN-SODIUM CHLORIDE 30-0.9 UT/500ML-% IV SOLN
2.5000 [IU]/h | INTRAVENOUS | Status: DC
  Filled 2023-08-11: qty 500

## 2023-08-11 MED ORDER — ONDANSETRON HCL 4 MG/2ML IJ SOLN
4.0000 mg | Freq: Four times a day (QID) | INTRAMUSCULAR | Status: DC | PRN
  Administered 2023-08-11: 4 mg via INTRAVENOUS
  Filled 2023-08-11: qty 2

## 2023-08-11 MED ORDER — IBUPROFEN 600 MG PO TABS
600.0000 mg | ORAL_TABLET | Freq: Four times a day (QID) | ORAL | Status: DC
  Administered 2023-08-11 – 2023-08-12 (×5): 600 mg via ORAL
  Filled 2023-08-11 (×5): qty 1

## 2023-08-11 MED ORDER — FLEET ENEMA RE ENEM: 1.0000 | ENEMA | RECTAL | Status: DC | PRN

## 2023-08-11 MED ORDER — LACTATED RINGERS IV SOLN: INTRAVENOUS | Status: DC

## 2023-08-11 MED ORDER — SIMETHICONE 80 MG PO CHEW: 80.0000 mg | CHEWABLE_TABLET | ORAL | Status: DC | PRN

## 2023-08-11 MED ORDER — LIDOCAINE HCL (PF) 1 % IJ SOLN: 30.0000 mL | INTRAMUSCULAR | Status: DC | PRN

## 2023-08-11 MED ORDER — DIBUCAINE (PERIANAL) 1 % EX OINT: 1.0000 | TOPICAL_OINTMENT | CUTANEOUS | Status: DC | PRN

## 2023-08-11 MED ORDER — FENTANYL-BUPIVACAINE-NACL 0.5-0.125-0.9 MG/250ML-% EP SOLN
12.0000 mL/h | EPIDURAL | Status: DC | PRN
  Administered 2023-08-11: 12 mL/h via EPIDURAL
  Filled 2023-08-11: qty 250

## 2023-08-11 MED ORDER — OXYCODONE-ACETAMINOPHEN 5-325 MG PO TABS: 2.0000 | ORAL_TABLET | ORAL | Status: DC | PRN

## 2023-08-11 MED ORDER — ONDANSETRON HCL 4 MG PO TABS: 4.0000 mg | ORAL_TABLET | ORAL | Status: DC | PRN

## 2023-08-11 MED ORDER — SODIUM CHLORIDE 0.9 % IV SOLN
2.0000 g | Freq: Once | INTRAVENOUS | Status: AC
  Administered 2023-08-11: 2 g via INTRAVENOUS
  Filled 2023-08-11: qty 2000

## 2023-08-11 MED ORDER — ZOLPIDEM TARTRATE 5 MG PO TABS: 5.0000 mg | ORAL_TABLET | Freq: Every evening | ORAL | Status: DC | PRN

## 2023-08-11 MED ORDER — SOD CITRATE-CITRIC ACID 500-334 MG/5ML PO SOLN: 30.0000 mL | ORAL | Status: DC | PRN

## 2023-08-11 MED ORDER — DIPHENHYDRAMINE HCL 50 MG/ML IJ SOLN: 12.5000 mg | INTRAMUSCULAR | Status: DC | PRN

## 2023-08-11 MED ORDER — EPHEDRINE 5 MG/ML INJ: 10.0000 mg | INTRAVENOUS | Status: DC | PRN

## 2023-08-11 MED ORDER — ONDANSETRON HCL 4 MG/2ML IJ SOLN: 4.0000 mg | INTRAMUSCULAR | Status: DC | PRN

## 2023-08-11 MED ORDER — SODIUM CHLORIDE 0.9 % IV SOLN
1.0000 g | INTRAVENOUS | Status: DC
  Administered 2023-08-11: 1 g via INTRAVENOUS
  Filled 2023-08-11: qty 1000

## 2023-08-11 MED ORDER — LIDOCAINE HCL (PF) 1 % IJ SOLN
INTRAMUSCULAR | Status: DC | PRN
  Administered 2023-08-11 (×2): 4 mL via EPIDURAL

## 2023-08-11 MED ORDER — ACETAMINOPHEN 325 MG PO TABS: 650.0000 mg | ORAL_TABLET | ORAL | Status: DC | PRN

## 2023-08-11 MED ORDER — PHENYLEPHRINE 80 MCG/ML (10ML) SYRINGE FOR IV PUSH (FOR BLOOD PRESSURE SUPPORT): 80.0000 ug | PREFILLED_SYRINGE | INTRAVENOUS | Status: DC | PRN

## 2023-08-11 MED ORDER — TETANUS-DIPHTH-ACELL PERTUSSIS 5-2.5-18.5 LF-MCG/0.5 IM SUSY: 0.5000 mL | PREFILLED_SYRINGE | Freq: Once | INTRAMUSCULAR | Status: DC

## 2023-08-11 MED ORDER — DIPHENHYDRAMINE HCL 25 MG PO CAPS: 25.0000 mg | ORAL_CAPSULE | Freq: Four times a day (QID) | ORAL | Status: DC | PRN

## 2023-08-11 MED ORDER — OXYCODONE-ACETAMINOPHEN 5-325 MG PO TABS: 1.0000 | ORAL_TABLET | ORAL | Status: DC | PRN

## 2023-08-11 MED ORDER — COCONUT OIL OIL: 1.0000 | TOPICAL_OIL | Status: DC | PRN

## 2023-08-11 MED ORDER — SENNOSIDES-DOCUSATE SODIUM 8.6-50 MG PO TABS
2.0000 | ORAL_TABLET | Freq: Every day | ORAL | Status: DC
  Administered 2023-08-12: 2 via ORAL
  Filled 2023-08-11: qty 2

## 2023-08-11 MED ORDER — WITCH HAZEL-GLYCERIN EX PADS
1.0000 | MEDICATED_PAD | CUTANEOUS | Status: DC | PRN
  Administered 2023-08-11: 1 via TOPICAL

## 2023-08-11 MED ORDER — SERTRALINE HCL 50 MG PO TABS
50.0000 mg | ORAL_TABLET | Freq: Every day | ORAL | Status: DC
  Administered 2023-08-11: 50 mg via ORAL
  Filled 2023-08-11: qty 1

## 2023-08-11 MED ORDER — BENZOCAINE-MENTHOL 20-0.5 % EX AERO
1.0000 | INHALATION_SPRAY | CUTANEOUS | Status: DC | PRN
  Administered 2023-08-11: 1 via TOPICAL
  Filled 2023-08-11: qty 56

## 2023-08-11 MED ORDER — OXYTOCIN BOLUS FROM INFUSION
333.0000 mL | Freq: Once | INTRAVENOUS | Status: AC
Start: 2023-08-11 — End: 2023-08-11
  Administered 2023-08-11: 333 mL via INTRAVENOUS

## 2023-08-11 MED ORDER — PRENATAL MULTIVITAMIN CH
1.0000 | ORAL_TABLET | Freq: Every day | ORAL | Status: DC
  Administered 2023-08-11 – 2023-08-12 (×2): 1 via ORAL
  Filled 2023-08-11 (×2): qty 1

## 2023-08-11 MED ORDER — LACTATED RINGERS IV SOLN
500.0000 mL | INTRAVENOUS | Status: DC | PRN
  Administered 2023-08-11: 1000 mL via INTRAVENOUS

## 2023-08-11 NOTE — Lactation Note (Signed)
 This note was copied from a baby's chart. Lactation Consultation Note  Patient Name: Deborah Roberson ZOXWR'U Date: 08/11/2023 Age:34 hours Reason for consult: Initial assessment;Late-preterm 34-36.6wks  MOB had infant latched on her left breast using the cradle hold,infant breastfeed for 15 minutes. MOB switched to her right breast using the football hold with pillow support and breastfeed for another 3 minutes before becoming sleeping, total feeding was 18 minutes. LC discussed hand expression using the breast model and MOB easily expressed 4 mls of colostrum which was spoon fed to infant.   MOB feels infant is breastfeeding well she had latch difficulties with her 1st child due to him having a lip and tongue tie, she exclusively pumped for 10 months. MOB goal with infant is  to work towards latching infant at the breast. Pediatric, MD discussed with [facility] although infant born 366/7 weeks, will  not follow LPTI feeding guidelines at this time. LC  services will  re-assess feedings after 24 hours of life. MOB was  made aware of O/P services, breastfeeding support groups, community resources, and our phone # for post-discharge questions.  Current feeding plan: 1-MOB will continue to breastfeed infant 8+ times within 24 hours, skin to skin.  2-MOB will continue to work on latching infant at the breast , and if need latch assistance will call RN/LC.  LC reviewed hunger and satiety cues with parents.  3-MOB at her discretion if she choose can hand express and give extra volume of EBM after latching infant at the breast.  Maternal Data Has patient been taught Hand Expression?: Yes Does the patient have breastfeeding experience prior to this delivery?: Yes How long did the patient breastfeed?: MOB informed LC she had latch difficulties with her 4 month old due to him having a lip and tongue tie, they did see Pediatric dentist but did not get it revised, she exclussively pumped for 10  months.  Feeding Mother's Current Feeding Choice: Breast Milk  LATCH Score Latch: Grasps breast easily, tongue down, lips flanged, rhythmical sucking.  Audible Swallowing: A few with stimulation  Type of Nipple: Everted at rest and after stimulation  Comfort (Breast/Nipple): Soft / non-tender  Hold (Positioning): Assistance needed to correctly position infant at breast and maintain latch.  LATCH Score: 8   Lactation Tools Discussed/Used    Interventions Interventions: Breast feeding basics reviewed;Assisted with latch;Skin to skin;Breast compression;Adjust position;Support pillows;Position options;Expressed milk;Education;LC Services brochure  Discharge Pump: DEBP;Personal  Consult Status Consult Status: Follow-up Date: 08/12/23 Follow-up type: In-patient    Pecolia Bourbon 08/11/2023, 5:01 PM

## 2023-08-11 NOTE — MAU Note (Addendum)
 Pt says here for UC's - 6/10 Was in office today - Dr Serge Dancer- VE - 5 cm - has small amt bloody show. Denies HSV GBS- unsure

## 2023-08-11 NOTE — H&P (Signed)
 Deborah Roberson is a 33 y.o. female presenting for UCs. PNC complicated by anxiety on sertraline. OB History     Gravida  2   Para  1   Term  1   Preterm      AB      Living  1      SAB      IAB      Ectopic      Multiple  0   Live Births  1          Past Medical History:  Diagnosis Date   Medical history non-contributory    Past Surgical History:  Procedure Laterality Date   WISDOM TOOTH EXTRACTION Bilateral    Family History: family history is not on file. Social History:  reports that she has never smoked. She has never used smokeless tobacco. She reports that she does not drink alcohol and does not use drugs.     Maternal Diabetes: No Genetic Screening: Normal Maternal Ultrasounds/Referrals: Normal Fetal Ultrasounds or other Referrals:  None Maternal Substance Abuse:  No Significant Maternal Medications:  None Significant Maternal Lab Results:  Group B Strep negative "presumptive negative" Number of Prenatal Visits:greater than 3 verified prenatal visits Maternal Vaccinations: Other Comments:  None  Review of Systems Maternal Medical History:  Reason for admission: Contractions.   Fetal activity: Perceived fetal activity is normal.     Dilation: 6 Effacement (%): 70 Station: -2 Exam by:: Perla, RN Blood pressure 139/82, pulse (!) 108, temperature 98 F (36.7 C), resp. rate 12, height 5\' 4"  (1.626 m), weight 101.8 kg, unknown if currently breastfeeding. Maternal Exam:  Abdomen: Fetal presentation: vertex   Physical Exam Cardiovascular:     Rate and Rhythm: Normal rate.  Pulmonary:     Effort: Pulmonary effort is normal.     Prenatal labs: ABO, Rh:   Antibody:   Rubella:   RPR:    HBsAg:    HIV:    GBS: PRESUMPTIVE NEGATIVE/-- (04/28 0207)   Assessment/Plan: 33 yo G2P1 @ 36 6/7 wks in labor "Presumptive negative" GBBS>swab repeated, will treat with PCN per protocol   Rudolm Coss II 08/11/2023, 4:38 AM

## 2023-08-11 NOTE — Progress Notes (Addendum)
 Deborah Roberson is a 33 y.o. G2P1001 at [redacted]w[redacted]d by ultrasound admitted for Preterm labor  Subjective: Comfortable with CLEA.  Objective: BP 112/75   Pulse (!) 112   Temp 98.1 F (36.7 C) (Oral)   Resp 16   Ht 5\' 4"  (1.626 m)   Wt 101.8 kg   SpO2 98%   BMI 38.54 kg/m  No intake/output data recorded. No intake/output data recorded.  FHT:  FHR: 140 bpm, variability: moderate,  accelerations:  Abscent,  decelerations:  Absent UC:   regular, every 4 minutes SVE:   Dilation: 8 Effacement (%): 100 Station: -1 Exam by:: Dr. Cipriano Roberson AROM, clear  Labs: Lab Results  Component Value Date   WBC 13.1 (H) 08/11/2023   HGB 11.8 (L) 08/11/2023   HCT 34.9 (L) 08/11/2023   MCV 91.1 08/11/2023   PLT 177 08/11/2023    Assessment / Plan: Augmentation of labor, progressing well  Labor: Progressing normally Preeclampsia:   n/a Fetal Wellbeing:  Category I Pain Control:  Epidural I/D:  n/a Anticipated MOD:  NSVD Amp for GBS unknown  Last u/s 4/22 showed LGA at 7#8 (97%) with large AC.  Patient was counseled then as well as today regarding increased risk of shoulder dystocia with associated maternal and fetal morbidity and mortality.  She last delivered newborn at 38 weeks weighing 7#6 and pushed for only about 5 minutes.  Patient declines primary C/S.    Deborah Glasgow, DO 08/11/2023, 8:35 AM

## 2023-08-11 NOTE — Anesthesia Preprocedure Evaluation (Signed)
Anesthesia Evaluation  Patient identified by MRN, date of birth, ID band Patient awake    Reviewed: Allergy & Precautions, Patient's Chart, lab work & pertinent test results  History of Anesthesia Complications Negative for: history of anesthetic complications  Airway Mallampati: II  TM Distance: >3 FB Neck ROM: Full    Dental no notable dental hx.    Pulmonary neg pulmonary ROS   Pulmonary exam normal        Cardiovascular negative cardio ROS Normal cardiovascular exam     Neuro/Psych negative neurological ROS     GI/Hepatic negative GI ROS, Neg liver ROS,,,  Endo/Other  negative endocrine ROS    Renal/GU negative Renal ROS  negative genitourinary   Musculoskeletal negative musculoskeletal ROS (+)    Abdominal   Peds  Hematology negative hematology ROS (+)   Anesthesia Other Findings Day of surgery medications reviewed with patient.  Reproductive/Obstetrics (+) Pregnancy                              Anesthesia Physical Anesthesia Plan  ASA: 2  Anesthesia Plan: Epidural   Post-op Pain Management:    Induction:   PONV Risk Score and Plan: Treatment may vary due to age or medical condition  Airway Management Planned: Natural Airway  Additional Equipment: Fetal Monitoring  Intra-op Plan:   Post-operative Plan:   Informed Consent: I have reviewed the patients History and Physical, chart, labs and discussed the procedure including the risks, benefits and alternatives for the proposed anesthesia with the patient or authorized representative who has indicated his/her understanding and acceptance.       Plan Discussed with:   Anesthesia Plan Comments:          Anesthesia Quick Evaluation

## 2023-08-11 NOTE — Anesthesia Procedure Notes (Signed)
 Epidural Patient location during procedure: OB Start time: 08/11/2023 5:49 AM End time: 08/11/2023 5:52 AM  Staffing Anesthesiologist: Vernadine Golas, MD Performed: anesthesiologist   Preanesthetic Checklist Completed: patient identified, IV checked, risks and benefits discussed, monitors and equipment checked, pre-op evaluation and timeout performed  Epidural Patient position: sitting Prep: DuraPrep and site prepped and draped Patient monitoring: continuous pulse ox, blood pressure and heart rate Approach: midline Location: L3-L4 Injection technique: LOR air  Needle:  Needle type: Tuohy  Needle gauge: 17 G Needle length: 9 cm Needle insertion depth: 5 cm Catheter type: closed end flexible Catheter size: 19 Gauge Catheter at skin depth: 10 cm Test dose: negative and Other (1% lidocaine )  Assessment Events: blood not aspirated, no cerebrospinal fluid, injection not painful, no injection resistance, no paresthesia and negative IV test  Additional Notes Patient identified. Risks, benefits, and alternatives discussed with patient including but not limited to bleeding, infection, nerve damage, paralysis, failed block, incomplete pain control, headache, blood pressure changes, nausea, vomiting, reactions to medication, itching, and postpartum back pain. Confirmed with bedside nurse the patient's most recent platelet count. Confirmed with patient that they are not currently taking any anticoagulation, have any bleeding history, or any family history of bleeding disorders. Patient expressed understanding and wished to proceed. All questions were answered. Sterile technique was used throughout the entire procedure. Please see nursing notes for vital signs.   Crisp LOR on first pass. Test dose was given through epidural catheter and negative prior to continuing to dose epidural or start infusion. Warning signs of high block given to the patient including shortness of breath,  tingling/numbness in hands, complete motor block, or any concerning symptoms with instructions to call for help. Patient was given instructions on fall risk and not to get out of bed. All questions and concerns addressed with instructions to call with any issues or inadequate analgesia.  Reason for block:procedure for pain

## 2023-08-12 LAB — CBC
HCT: 30.7 % — ABNORMAL LOW (ref 36.0–46.0)
Hemoglobin: 10.2 g/dL — ABNORMAL LOW (ref 12.0–15.0)
MCH: 30.5 pg (ref 26.0–34.0)
MCHC: 33.2 g/dL (ref 30.0–36.0)
MCV: 91.9 fL (ref 80.0–100.0)
Platelets: 187 10*3/uL (ref 150–400)
RBC: 3.34 MIL/uL — ABNORMAL LOW (ref 3.87–5.11)
RDW: 13.1 % (ref 11.5–15.5)
WBC: 13.5 10*3/uL — ABNORMAL HIGH (ref 4.0–10.5)
nRBC: 0 % (ref 0.0–0.2)

## 2023-08-12 MED ORDER — RHO D IMMUNE GLOBULIN 1500 UNIT/2ML IJ SOSY
300.0000 ug | PREFILLED_SYRINGE | Freq: Once | INTRAMUSCULAR | Status: AC
  Administered 2023-08-12: 300 ug via INTRAVENOUS
  Filled 2023-08-12: qty 2

## 2023-08-12 MED ORDER — IBUPROFEN 600 MG PO TABS: 600.0000 mg | ORAL_TABLET | Freq: Four times a day (QID) | ORAL | 0 refills | Status: AC | PRN

## 2023-08-12 NOTE — Social Work (Signed)
 MOB was referred for history of depression/anxiety.  * Referral screened out by Clinical Social Worker because none of the following criteria appear to apply:  ~ History of anxiety/depression during this pregnancy, or of post-partum depression following prior delivery.  ~ Diagnosis of anxiety and/or depression within last 3 years OR * MOB's symptoms currently being treated with medication and/or therapy. Per chart review MOB is currently prescribed and taking Zoloft 50mg . Edinburgh=5  Please contact the Clinical Social Worker if needs arise, or by MOB request.   Haroldine Likens, LCSWA Clinical Social Worker [phone]

## 2023-08-12 NOTE — Anesthesia Postprocedure Evaluation (Signed)
 Anesthesia Post Note  Patient: Deborah Roberson  Procedure(s) Performed: AN AD HOC LABOR EPIDURAL     Patient location during evaluation: Mother Baby Anesthesia Type: Epidural Level of consciousness: awake, oriented and awake and alert Pain management: pain level controlled Vital Signs Assessment: post-procedure vital signs reviewed and stable Respiratory status: spontaneous breathing, nonlabored ventilation and respiratory function stable Cardiovascular status: stable Postop Assessment: no headache, patient able to bend at knees, adequate PO intake, able to ambulate and no apparent nausea or vomiting Anesthetic complications: no   No notable events documented.  Last Vitals:  Vitals:   08/12/23 0200 08/12/23 0549  BP: 120/73 122/80  Pulse: 88 86  Resp: 17 17  Temp: 36.9 C 36.8 C  SpO2: 99% 99%    Last Pain:  Vitals:   08/12/23 0549  TempSrc: Oral  PainSc:    Pain Goal: Patients Stated Pain Goal: 0 (08/11/23 0519)                 Shreyansh Tiffany

## 2023-08-12 NOTE — Progress Notes (Signed)
 Post Partum Day 1 Subjective: no complaints, up ad lib, voiding, and tolerating PO  Objective: Blood pressure 122/80, pulse 86, temperature 98.2 F (36.8 C), temperature source Oral, resp. rate 17, height 5\' 4"  (1.626 m), weight 101.8 kg, SpO2 99%, unknown if currently breastfeeding.  Physical Exam:  General: alert, cooperative, appears stated age, and no distress Lochia: appropriate Uterine Fundus: firm Incision:   DVT Evaluation: No evidence of DVT seen on physical exam.  Recent Labs    08/11/23 0426 08/12/23 0416  HGB 11.8* 10.2*  HCT 34.9* 30.7*    Assessment/Plan: Breastfeeding and Circumcision prior to discharge   LOS: 1 day   Denette Finner, MD 08/12/2023, 9:36 AM

## 2023-08-12 NOTE — Lactation Note (Signed)
 This note was copied from a baby's chart. Lactation Consultation Note  Patient Name: Boy [place] Birchmeier [id number] Date: 08/12/2023 Age:34 hours Reason for consult: Follow-up assessment;Early term 37-38.6wks  P2, Baby 37 weeks.  Mother states baby has been having short feedings.  Discussed skin to skin, waking techniques and pumping and giving volume back if baby is sleepy at the breast since he is an early term baby. Reviewed engorgement care and monitoring voids/stools.  Maternal Data Has patient been taught Hand Expression?: Yes  Feeding Mother's Current Feeding Choice: Breast Milk   Interventions Interventions: Education  Discharge Discharge Education: Engorgement and breast care;Warning signs for feeding baby  Consult Status Consult Status: Complete Date: 08/12/23  Luellen Sages  RN, IBCLC 08/12/2023, 2:54 PM

## 2023-08-12 NOTE — Discharge Summary (Signed)
 Postpartum Discharge Summary       Patient Name: Deborah Roberson DOB: 1990-07-25 MRN: [id number]  Date of admission: 08/11/2023 Delivery date:08/11/2023 Delivering provider: [name] Date of discharge: 08/12/2023  Admitting diagnosis: Normal labor and delivery [O80] Preterm delivery [O60.10X0] Intrauterine pregnancy: [redacted]w[redacted]d     Secondary diagnosis:  Principal Problem:   Normal labor and delivery Active Problems:   Preterm delivery  Additional problems:      Discharge diagnosis: Preterm Pregnancy Delivered                                              Post partum procedures:   Augmentation: N/A  Complications: None  Hospital course: Onset of Labor With Vaginal Delivery      33 y.o. yo G2X5284 at [redacted]w[redacted]d was admitted in Active Labor on 08/11/2023. Labor course was uncomplicated  Membrane Rupture Time/Date: 8:26 AM,08/11/2023  Delivery Method:Vaginal, Spontaneous Operative Delivery:N/A Episiotomy: None Lacerations:  2nd degree Patient had a postpartum course uncomplicated.  She is ambulating, tolerating a regular diet, passing flatus, and urinating well. Patient is discharged home in stable condition on 08/12/23.  Newborn Data: Birth date:08/11/2023 Birth time:10:43 AM Gender:Female Living status:Living Apgars:9 ,9  Weight:3770 g  Magnesium Sulfate received: No BMZ received: No Rhophylac :N/A MMR:N/A T-DaP:Given prenatally Flu: Yes RSV Vaccine received: No Transfusion:No Immunizations administered: Immunization History  Administered Date(s) Administered   Tdap 04/27/2008, 07/17/2021, 06/10/2023    Physical exam  Vitals:   08/11/23 1707 08/11/23 2151 08/12/23 0200 08/12/23 0549  BP: 127/84 131/73 120/73 122/80  Pulse: 97 96 88 86  Resp: 18 16 17 17   Temp: 98.8 F (37.1 C) 98.5 F (36.9 C) 98.5 F (36.9 C) 98.2 F (36.8 C)  TempSrc: Oral Oral Oral Oral  SpO2: 98% 99% 99% 99%  Weight:      Height:       General: alert, cooperative, and no  distress Lochia: appropriate Uterine Fundus: firm Incision: N/A DVT Evaluation: No evidence of DVT seen on physical exam. Labs: Lab Results  Component Value Date   WBC 13.5 (H) 08/12/2023   HGB 10.2 (L) 08/12/2023   HCT 30.7 (L) 08/12/2023   MCV 91.9 08/12/2023   PLT 187 08/12/2023       No data to display         Edinburgh Score:    08/12/2023   10:45 AM  Edinburgh Postnatal Depression Scale Screening Tool  I have been able to laugh and see the funny side of things. 0  I have looked forward with enjoyment to things. 0  I have blamed myself unnecessarily when things went wrong. 1  I have been anxious or worried for no good reason. 2  I have felt scared or panicky for no good reason. 1  Things have been getting on top of me. 1  I have been so unhappy that I have had difficulty sleeping. 0  I have felt sad or miserable. 0  I have been so unhappy that I have been crying. 0  The thought of harming myself has occurred to me. 0  Edinburgh Postnatal Depression Scale Total 5      After visit meds:  Allergies as of 08/12/2023   No Known Allergies      Medication List     TAKE these medications    ibuprofen  600 MG tablet Commonly known as:  ADVIL  Take 1 tablet (600 mg total) by mouth every 6 (six) hours as needed for mild pain (pain score 1-3), moderate pain (pain score 4-6) or cramping.   prenatal multivitamin Tabs tablet Take 1 tablet by mouth daily at 12 noon.   sertraline  50 MG tablet Commonly known as: ZOLOFT  Take 50 mg by mouth at bedtime.         Discharge home in stable condition Infant Feeding: Breast Infant Disposition:home with mother Discharge instruction: per After Visit Summary and Postpartum booklet. Activity: Advance as tolerated. Pelvic rest for 6 weeks.  Diet: routine diet Anticipated Birth Control: Unsure Postpartum Appointment:6 weeks Additional Postpartum F/U:    Future Appointments:No future appointments. Follow up Visit:       08/12/2023 Denette Finner, MD

## 2023-08-13 LAB — RH IG WORKUP (INCLUDES ABO/RH)
Fetal Screen: NEGATIVE
Gestational Age(Wks): 36.6
Unit division: 0

## 2023-08-13 LAB — SURGICAL PATHOLOGY

## 2023-08-24 ENCOUNTER — Telehealth (HOSPITAL_COMMUNITY): Payer: Self-pay

## 2023-08-24 NOTE — Telephone Encounter (Signed)
 08/24/2023 1840  Name: Deborah Roberson MRN: [id number] DOB: 1991/02/27  Reason for Call:  Transition of Care Hospital Discharge Call  Contact Status: Patient Contact Status: Complete  Language assistant needed:          Follow-Up Questions: Do You Have Any Concerns About Your Health As You Heal From Delivery?: No Do You Have Any Concerns About Your Infants Health?: No  Edinburgh Postnatal Depression Scale:  In the Past 7 Days: I have been able to laugh and see the funny side of things.: As much as I always could I have looked forward with enjoyment to things.: As much as I ever did I have blamed myself unnecessarily when things went wrong.: Not very often I have been anxious or worried for no good reason.: No, not at all I have felt scared or panicky for no good reason.: No, not at all Things have been getting on top of me.: No, most of the time I have coped quite well I have been so unhappy that I have had difficulty sleeping.: Not at all I have felt sad or miserable.: No, not at all I have been so unhappy that I have been crying.: No, never The thought of harming myself has occurred to me.: Never Edinburgh Postnatal Depression Scale Total: 2  PHQ2-9 Depression Scale:     Discharge Follow-up: Edinburgh score requires follow up?: No Patient was advised of the following resources:: Breastfeeding Support Group, Support Group  Post-discharge interventions: Reviewed Newborn Safe Sleep Practices  Signature  [place] [employer]
